# Patient Record
Sex: Male | Born: 2000 | Race: Black or African American | Hispanic: No | Marital: Single | Smoking: Never smoker
Health system: Southern US, Community
[De-identification: ages and names within clinical notes are randomized; demographics above are authoritative.]

---
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
## 2019-10-20 ENCOUNTER — Ambulatory Visit (INDEPENDENT_AMBULATORY_CARE_PROVIDER_SITE_OTHER): Payer: BC Managed Care – PPO | Admitting: Family Medicine

## 2019-10-20 ENCOUNTER — Encounter: Payer: Self-pay | Admitting: Family Medicine

## 2019-10-20 VITALS — BP 114/68 | HR 79 | Temp 97.7°F | Ht 73.23 in | Wt 133.3 lb

## 2019-10-20 DIAGNOSIS — R636 Underweight: Secondary | ICD-10-CM | POA: Diagnosis not present

## 2019-10-20 DIAGNOSIS — Z13220 Encounter for screening for lipoid disorders: Secondary | ICD-10-CM

## 2019-10-20 DIAGNOSIS — Z0000 Encounter for general adult medical examination without abnormal findings: Secondary | ICD-10-CM | POA: Diagnosis not present

## 2019-10-20 LAB — COMPLETE METABOLIC PANEL WITH GFR
AG Ratio: 1.6 (calc) (ref 1.0–2.5)
ALT: 17 U/L (ref 9–46)
AST: 18 U/L (ref 10–40)
Albumin: 4.5 g/dL (ref 3.6–5.1)
Alkaline phosphatase (APISO): 62 U/L (ref 36–130)
BUN: 14 mg/dL (ref 7–25)
CO2: 28 mmol/L (ref 20–32)
Calcium: 9.6 mg/dL (ref 8.6–10.3)
Chloride: 104 mmol/L (ref 98–110)
Creat: 0.79 mg/dL (ref 0.60–1.35)
GFR, Est African American: 149 mL/min/{1.73_m2} (ref >=60)
GFR, Est Non African American: 128 mL/min/{1.73_m2} (ref >=60)
Globulin: 2.8 g/dL (calc) (ref 1.9–3.7)
Glucose, Bld: 83 mg/dL (ref 65–99)
Potassium: 4.2 mmol/L (ref 3.5–5.3)
Sodium: 139 mmol/L (ref 135–146)
Total Bilirubin: 0.6 mg/dL (ref 0.2–1.2)
Total Protein: 7.3 g/dL (ref 6.1–8.1)

## 2019-10-20 LAB — LIPID PANEL
Cholesterol: 143 mg/dL (ref <200)
HDL: 58 mg/dL (ref >=40)
LDL Cholesterol (Calc): 73 mg/dL (calc)
Non-HDL Cholesterol (Calc): 85 mg/dL (calc) (ref <130)
Total CHOL/HDL Ratio: 2.5 (calc) (ref <5.0)
Triglycerides: 43 mg/dL (ref <150)

## 2019-10-20 LAB — CBC
HCT: 48.1 % (ref 38.5–50.0)
Hemoglobin: 16.3 g/dL (ref 13.2–17.1)
MCH: 31 pg (ref 27.0–33.0)
MCHC: 33.9 g/dL (ref 32.0–36.0)
MCV: 91.6 fL (ref 80.0–100.0)
MPV: 10 fL (ref 7.5–12.5)
Platelets: 184 10*3/uL (ref 140–400)
RBC: 5.25 10*6/uL (ref 4.20–5.80)
RDW: 12.2 % (ref 11.0–15.0)
WBC: 3.6 10*3/uL — ABNORMAL LOW (ref 3.8–10.8)

## 2019-10-20 LAB — TSH: TSH: 1.86 mIU/L (ref 0.40–4.50)

## 2019-10-20 NOTE — Patient Instructions (Signed)
Preventive Care 19-21 Years Old, Male Preventive care refers to lifestyle choices and visits with your health care provider that can promote health and wellness. This includes:  A yearly physical exam. This is also called an annual well check.  Regular dental and eye exams.  Immunizations.  Screening for certain conditions.  Healthy lifestyle choices, such as eating a healthy diet, getting regular exercise, not using drugs or products that contain nicotine and tobacco, and limiting alcohol use. What can I expect for my preventive care visit? Physical exam Your health care provider will check:  Height and weight. These may be used to calculate body mass index (BMI), which is a measurement that tells if you are at a healthy weight.  Heart rate and blood pressure.  Your skin for abnormal spots. Counseling Your health care provider may ask you questions about:  Alcohol, tobacco, and drug use.  Emotional well-being.  Home and relationship well-being.  Sexual activity.  Eating habits.  Work and work Statistician. What immunizations do I need?  Influenza (flu) vaccine  This is recommended every year. Tetanus, diphtheria, and pertussis (Tdap) vaccine  You may need a Td booster every 10 years. Varicella (chickenpox) vaccine  You may need this vaccine if you have not already been vaccinated. Human papillomavirus (HPV) vaccine  If recommended by your health care provider, you may need three doses over 6 months. Measles, mumps, and rubella (MMR) vaccine  You may need at least one dose of MMR. You may also need a second dose. Meningococcal conjugate (MenACWY) vaccine  One dose is recommended if you are 45-76 years old and a Market researcher living in a residence hall, or if you have one of several medical conditions. You may also need additional booster doses. Pneumococcal conjugate (PCV13) vaccine  You may need this if you have certain conditions and were not  previously vaccinated. Pneumococcal polysaccharide (PPSV23) vaccine  You may need one or two doses if you smoke cigarettes or if you have certain conditions. Hepatitis A vaccine  You may need this if you have certain conditions or if you travel or work in places where you may be exposed to hepatitis A. Hepatitis B vaccine  You may need this if you have certain conditions or if you travel or work in places where you may be exposed to hepatitis B. Haemophilus influenzae type b (Hib) vaccine  You may need this if you have certain risk factors. You may receive vaccines as individual doses or as more than one vaccine together in one shot (combination vaccines). Talk with your health care provider about the risks and benefits of combination vaccines. What tests do I need? Blood tests  Lipid and cholesterol levels. These may be checked every 5 years starting at age 17.  Hepatitis C test.  Hepatitis B test. Screening   Diabetes screening. This is done by checking your blood sugar (glucose) after you have not eaten for a while (fasting).  Sexually transmitted disease (STD) testing. Talk with your health care provider about your test results, treatment options, and if necessary, the need for more tests. Follow these instructions at home: Eating and drinking   Eat a diet that includes fresh fruits and vegetables, whole grains, lean protein, and low-fat dairy products.  Take vitamin and mineral supplements as recommended by your health care provider.  Do not drink alcohol if your health care provider tells you not to drink.  If you drink alcohol: ? Limit how much you have to 0-2  drinks a day. ? Be aware of how much alcohol is in your drink. In the [place] one drink equals one 12 oz bottle of beer (355 mL), one 5 oz glass of wine (148 mL), or one 1 oz glass of hard liquor (44 mL). Lifestyle  Take daily care of your teeth and gums.  Stay active. Exercise for at least 30 minutes on 5 or  more days each week.  Do not use any products that contain nicotine or tobacco, such as cigarettes, e-cigarettes, and chewing tobacco. If you need help quitting, ask your health care provider.  If you are sexually active, practice safe sex. Use a condom or other form of protection to prevent STIs (sexually transmitted infections). What's next?  Go to your health care provider once a year for a well check visit.  Ask your health care provider how often you should have your eyes and teeth checked.  Stay up to date on all vaccines. This information is not intended to replace advice given to you by your health care provider. Make sure you discuss any questions you have with your health care provider. Document Revised: 05/23/2018 Document Reviewed: 05/23/2018 Elsevier Patient Education  2020 [employer].

## 2019-10-20 NOTE — Assessment & Plan Note (Signed)
Well adult Orders Placed This Encounter  Procedures  . COMPLETE METABOLIC PANEL WITH GFR  . TSH  . Lipid Profile  . CBC  Screening: Lipid. Checking TSH due to difficulty gaining weight.  Immunizations: UTD Anticipatory guidance/Risk factor reduction:  Recommendations per AVS

## 2019-10-20 NOTE — Progress Notes (Signed)
Manuel Nelson - 19 y.o. male MRN [id number]  Date of birth: June 20, 2000  Subjective Chief Complaint  Patient presents with  . Establish Care    HPI Manuel Nelson is a 19 y.o. male here today for initial visit and annual exam.  He has been in good health.  He has concern about having difficulty gaining weight.  History of thyroid issues in a couple of grandparents, unsure what type.  He has been looking at joining the [employer] but currently is not meeting height/weight requirement.    He does exercise occasionally.  He follows a pretty healthy diet.    He does consume EtOH socially.  He is a non-smoker.   Review of Systems  Constitutional: Negative for chills, fever, malaise/fatigue and weight loss.  HENT: Negative for congestion, ear pain and sore throat.   Eyes: Negative for blurred vision, double vision and pain.  Respiratory: Negative for cough and shortness of breath.   Cardiovascular: Negative for chest pain and palpitations.  Gastrointestinal: Negative for abdominal pain, blood in stool, constipation, heartburn and nausea.  Genitourinary: Negative for dysuria and urgency.  Musculoskeletal: Negative for joint pain and myalgias.  Neurological: Negative for dizziness and headaches.  Endo/Heme/Allergies: Does not bruise/bleed easily.  Psychiatric/Behavioral: Negative for depression. The patient is not nervous/anxious and does not have insomnia.     No Known Allergies  History reviewed. No pertinent past medical history.  History reviewed. No pertinent surgical history.  Social History   Socioeconomic History  . Marital status: Single    Spouse name: Not on file  . Number of children: Not on file  . Years of education: Not on file  . Highest education level: Not on file  Occupational History  . Occupation: Chef  Tobacco Use  . Smoking status: Never Smoker  . Smokeless tobacco: Never Used  Substance and Sexual Activity  . Alcohol use: No    Alcohol/week: 0.0 standard  drinks  . Drug use: No  . Sexual activity: Never    Partners: Female  Other Topics Concern  . Not on file  Social History Narrative  . Not on file   Social Determinants of Health   Financial Resource Strain:   . Difficulty of Paying Living Expenses:   Food Insecurity:   . Worried About Charity fundraiser in the Last Year:   . Arboriculturist in the Last Year:   Transportation Needs:   . Film/video editor (Medical):   Marland Kitchen Lack of Transportation (Non-Medical):   Physical Activity:   . Days of Exercise per Week:   . Minutes of Exercise per Session:   Stress:   . Feeling of Stress :   Social Connections:   . Frequency of Communication with Friends and Family:   . Frequency of Social Gatherings with Friends and Family:   . Attends Religious Services:   . Active Member of Clubs or Organizations:   . Attends Archivist Meetings:   Marland Kitchen Marital Status:     Family History  Problem Relation Age of Onset  . Thyroid disease Maternal Grandmother   . Hypertension Maternal Grandfather     Health Maintenance  Topic Date Due  . HIV Screening  Never done  . TETANUS/TDAP  Never done  . INFLUENZA VACCINE  01/11/2020  . COVID-19 Vaccine  Completed     ----------------------------------------------------------------------------------------------------------------------------------------------------------------------------------------------------------------- Physical Exam BP 114/68 (BP Location: Left Arm, Patient Position: Sitting, Cuff Size: Normal)   Pulse 79   Temp 97.7 F (  36.5 C) (Oral)   Ht 6' 1.23" (1.86 m)   Wt 133 lb 4.8 oz (60.5 kg)   SpO2 100%   BMI 17.48 kg/m   Physical Exam Constitutional:      General: He is not in acute distress. HENT:     Head: Normocephalic and atraumatic.     Right Ear: Tympanic membrane and external ear normal.     Left Ear: Tympanic membrane and external ear normal.  Eyes:     General: No scleral icterus. Neck:      Thyroid: No thyromegaly.  Cardiovascular:     Rate and Rhythm: Normal rate and regular rhythm.     Heart sounds: Normal heart sounds.  Pulmonary:     Effort: Pulmonary effort is normal.     Breath sounds: Normal breath sounds.  Abdominal:     General: Bowel sounds are normal. There is no distension.     Palpations: Abdomen is soft.     Tenderness: There is no abdominal tenderness. There is no guarding.  Musculoskeletal:     Cervical back: Normal range of motion.  Lymphadenopathy:     Cervical: No cervical adenopathy.  Skin:    General: Skin is warm and dry.     Findings: No rash.  Neurological:     Mental Status: He is alert and oriented to person, place, and time.     Cranial Nerves: No cranial nerve deficit.     Motor: No abnormal muscle tone.  Psychiatric:        Behavior: Behavior normal.     ------------------------------------------------------------------------------------------------------------------------------------------------------------------------------------------------------------------- Assessment and Plan  Well adult exam Well adult Orders Placed This Encounter  Procedures  . COMPLETE METABOLIC PANEL WITH GFR  . TSH  . Lipid Profile  . CBC  Screening: Lipid. Checking TSH due to difficulty gaining weight.  Immunizations: UTD Anticipatory guidance/Risk factor reduction:  Recommendations per AVS   No orders of the defined types were placed in this encounter.   No follow-ups on file.    This visit occurred during the SARS-CoV-2 public health emergency.  Safety protocols were in place, including screening questions prior to the visit, additional usage of staff PPE, and extensive cleaning of exam room while observing appropriate contact time as indicated for disinfecting solutions.

## 2019-11-03 ENCOUNTER — Encounter: Payer: BC Managed Care – PPO | Admitting: Family Medicine

## 2020-11-15 ENCOUNTER — Encounter: Payer: Self-pay | Admitting: Family Medicine

## 2020-11-15 ENCOUNTER — Ambulatory Visit (INDEPENDENT_AMBULATORY_CARE_PROVIDER_SITE_OTHER): Payer: BC Managed Care – PPO | Admitting: Family Medicine

## 2020-11-15 ENCOUNTER — Other Ambulatory Visit: Payer: Self-pay

## 2020-11-15 VITALS — BP 131/75 | HR 98 | Temp 98.1°F | Ht 73.62 in | Wt 141.7 lb

## 2020-11-15 DIAGNOSIS — Z13220 Encounter for screening for lipoid disorders: Secondary | ICD-10-CM | POA: Diagnosis not present

## 2020-11-15 DIAGNOSIS — Z0000 Encounter for general adult medical examination without abnormal findings: Secondary | ICD-10-CM | POA: Diagnosis not present

## 2020-11-15 NOTE — Assessment & Plan Note (Signed)
Well adult Orders Placed This Encounter  Procedures  . COMPLETE METABOLIC PANEL WITH GFR  . CBC with Differential  . Lipid Panel w/reflex Direct LDL  Screening: Lipid panel Immunizations; UTD Anticipatory guidance/Risk factor reduction:  Recommendations per AVS. 

## 2020-11-15 NOTE — Progress Notes (Signed)
Manuel Nelson - 20 y.o. male MRN [id number]  Date of birth: February 10, 2001  Subjective Chief Complaint  Patient presents with  . Annual Exam    HPI Manuel Nelson is a 20 y.o. male here today for annual exam.  He has been in fairly good health.  He did recently return from boot camp for the [employer] after receiving medical discharge for rib pain.  Rib pain has since resolved.  He has no new concerns today.   He is a non-smoker and denies EtOH intake.   He does exercise regularly and feels like his diet is pretty good.    Review of Systems  Constitutional: Negative for chills, fever, malaise/fatigue and weight loss.  HENT: Negative for congestion, ear pain and sore throat.   Eyes: Negative for blurred vision, double vision and pain.  Respiratory: Negative for cough and shortness of breath.   Cardiovascular: Negative for chest pain and palpitations.  Gastrointestinal: Negative for abdominal pain, blood in stool, constipation, heartburn and nausea.  Genitourinary: Negative for dysuria and urgency.  Musculoskeletal: Negative for joint pain and myalgias.  Neurological: Negative for dizziness and headaches.  Endo/Heme/Allergies: Does not bruise/bleed easily.  Psychiatric/Behavioral: Negative for depression. The patient is not nervous/anxious and does not have insomnia.     No Known Allergies  History reviewed. No pertinent past medical history.  History reviewed. No pertinent surgical history.  Social History   Socioeconomic History  . Marital status: Single    Spouse name: Not on file  . Number of children: Not on file  . Years of education: Not on file  . Highest education level: Not on file  Occupational History  . Occupation: Chef  Tobacco Use  . Smoking status: Never Smoker  . Smokeless tobacco: Never Used  Vaping Use  . Vaping Use: Never used  Substance and Sexual Activity  . Alcohol use: No    Alcohol/week: 0.0 standard drinks  . Drug use: No  . Sexual activity:  Never    Partners: Female  Other Topics Concern  . Not on file  Social History Narrative  . Not on file   Social Determinants of Health   Financial Resource Strain: Not on file  Food Insecurity: Not on file  Transportation Needs: Not on file  Physical Activity: Not on file  Stress: Not on file  Social Connections: Not on file    Family History  Problem Relation Age of Onset  . Thyroid disease Maternal Grandmother   . Hypertension Maternal Grandfather     Health Maintenance  Topic Date Due  . HIV Screening  Never done  . Hepatitis C Screening  Never done  . COVID-19 Vaccine (3 - Booster for Moderna series) 02/17/2020  . INFLUENZA VACCINE  01/10/2021  . TETANUS/TDAP  09/16/2030  . Zoster Vaccines- Shingrix (1 of 2) 10/06/2048  . HPV VACCINES  Completed  . Pneumococcal Vaccine 48-66 Years old  Aged Out     ----------------------------------------------------------------------------------------------------------------------------------------------------------------------------------------------------------------- Physical Exam BP 131/75 (BP Location: Left Arm, Patient Position: Sitting, Cuff Size: Small)   Pulse 98   Temp 98.1 F (36.7 C)   Ht 6' 1.62" (1.87 m)   Wt 141 lb 11.2 oz (64.3 kg)   SpO2 99%   BMI 18.38 kg/m   Physical Exam Constitutional:      General: He is not in acute distress. HENT:     Head: Normocephalic and atraumatic.     Right Ear: External ear normal.     Left Ear: External ear  normal.     Mouth/Throat:     Mouth: Mucous membranes are moist.  Eyes:     General: No scleral icterus. Neck:     Thyroid: No thyromegaly.  Cardiovascular:     Rate and Rhythm: Normal rate and regular rhythm.     Heart sounds: Normal heart sounds.  Pulmonary:     Effort: Pulmonary effort is normal.     Breath sounds: Normal breath sounds.  Abdominal:     General: Bowel sounds are normal. There is no distension.     Palpations: Abdomen is soft.      Tenderness: There is no abdominal tenderness. There is no guarding.  Musculoskeletal:     Cervical back: Normal range of motion.  Lymphadenopathy:     Cervical: No cervical adenopathy.  Skin:    General: Skin is warm and dry.     Findings: No rash.  Neurological:     Mental Status: He is alert and oriented to person, place, and time.     Cranial Nerves: No cranial nerve deficit.     Motor: No abnormal muscle tone.  Psychiatric:        Mood and Affect: Mood normal.        Behavior: Behavior normal.     ------------------------------------------------------------------------------------------------------------------------------------------------------------------------------------------------------------------- Assessment and Plan  Well adult exam Well adult Orders Placed This Encounter  Procedures  . COMPLETE METABOLIC PANEL WITH GFR  . CBC with Differential  . Lipid Panel w/reflex Direct LDL  Screening: Lipid panel Immunizations: UTD Anticipatory guidance/Risk factor reduction:  Recommendations per AVS   No orders of the defined types were placed in this encounter.   No follow-ups on file.    This visit occurred during the SARS-CoV-2 public health emergency.  Safety protocols were in place, including screening questions prior to the visit, additional usage of staff PPE, and extensive cleaning of exam room while observing appropriate contact time as indicated for disinfecting solutions.

## 2020-11-15 NOTE — Patient Instructions (Signed)
Preventive Care 22-22 Years Old, Male Preventive care refers to lifestyle choices and visits with your health care provider that can promote health and wellness. This includes:  A yearly physical exam. This is also called an annual wellness visit.  Regular dental and eye exams.  Immunizations.  Screening for certain conditions.  Healthy lifestyle choices, such as: ? Eating a healthy diet. ? Getting regular exercise. ? Not using drugs or products that contain nicotine and tobacco. ? Limiting alcohol use. What can I expect for my preventive care visit? Physical exam Your health care provider may check your:  Height and weight. These may be used to calculate your BMI (body mass index). BMI is a measurement that tells if you are at a healthy weight.  Heart rate and blood pressure.  Body temperature.  Skin for abnormal spots. Counseling Your health care provider may ask you questions about your:  Past medical problems.  Family's medical history.  Alcohol, tobacco, and drug use.  Emotional well-being.  Home life and relationship well-being.  Sexual activity.  Diet, exercise, and sleep habits.  Work and work environment.  Access to firearms. What immunizations do I need? Vaccines are usually given at various ages, according to a schedule. Your health care provider will recommend vaccines for you based on your age, medical history, and lifestyle or other factors, such as travel or where you work.   What tests do I need? Blood tests  Lipid and cholesterol levels. These may be checked every 5 years starting at age 20.  Hepatitis C test.  Hepatitis B test. Screening  Diabetes screening. This is done by checking your blood sugar (glucose) after you have not eaten for a while (fasting).  Genital exam to check for testicular cancer or hernias.  STD (sexually transmitted disease) testing, if you are at risk. Talk with your health care provider about your test results,  treatment options, and if necessary, the need for more tests.   Follow these instructions at home: Eating and drinking  Eat a healthy diet that includes fresh fruits and vegetables, whole grains, lean protein, and low-fat dairy products.  Drink enough fluid to keep your urine pale yellow.  Take vitamin and mineral supplements as recommended by your health care provider.  Do not drink alcohol if your health care provider tells you not to drink.  If you drink alcohol: ? Limit how much you have to 0-2 drinks a day. ? Be aware of how much alcohol is in your drink. In the [place] one drink equals one 12 oz bottle of beer (355 mL), one 5 oz glass of wine (148 mL), or one 1 oz glass of hard liquor (44 mL).   Lifestyle  Take daily care of your teeth and gums. Brush your teeth every morning and night with fluoride toothpaste. Floss one time each day.  Stay active. Exercise for at least 30 minutes 5 or more days each week.  Do not use any products that contain nicotine or tobacco, such as cigarettes, e-cigarettes, and chewing tobacco. If you need help quitting, ask your health care provider.  Do not use drugs.  If you are sexually active, practice safe sex. Use a condom or other form of protection to prevent STIs (sexually transmitted infections).  Find healthy ways to cope with stress, such as: ? Meditation, yoga, or listening to music. ? Journaling. ? Talking to a trusted person. ? Spending time with friends and family. Safety  Always wear your seat belt while driving   or riding in a vehicle.  Do not drive: ? If you have been drinking alcohol. Do not ride with someone who has been drinking. ? When you are tired or distracted. ? While texting.  Wear a helmet and other protective equipment during sports activities.  If you have firearms in your house, make sure you follow all gun safety procedures.  Seek help if you have been physically or sexually abused. What's next?  Go to your  health care provider once a year for an annual wellness visit.  Ask your health care provider how often you should have your eyes and teeth checked.  Stay up to date on all vaccines. This information is not intended to replace advice given to you by your health care provider. Make sure you discuss any questions you have with your health care provider. Document Revised: 02/12/2019 Document Reviewed: 05/23/2018 Elsevier Patient Education  2021 Elsevier Inc.  

## 2020-11-16 LAB — COMPLETE METABOLIC PANEL WITH GFR
AG Ratio: 1.2 (calc) (ref 1.0–2.5)
ALT: 108 U/L — ABNORMAL HIGH (ref 9–46)
AST: 36 U/L (ref 10–40)
Albumin: 4.3 g/dL (ref 3.6–5.1)
Alkaline phosphatase (APISO): 108 U/L (ref 36–130)
BUN: 14 mg/dL (ref 7–25)
CO2: 29 mmol/L (ref 20–32)
Calcium: 9.9 mg/dL (ref 8.6–10.3)
Chloride: 99 mmol/L (ref 98–110)
Creat: 0.96 mg/dL (ref 0.60–1.35)
GFR, Est African American: 130 mL/min/{1.73_m2} (ref >=60)
GFR, Est Non African American: 112 mL/min/{1.73_m2} (ref >=60)
Globulin: 3.7 g/dL (calc) (ref 1.9–3.7)
Glucose, Bld: 108 mg/dL — ABNORMAL HIGH (ref 65–99)
Potassium: 4.3 mmol/L (ref 3.5–5.3)
Sodium: 139 mmol/L (ref 135–146)
Total Bilirubin: 0.5 mg/dL (ref 0.2–1.2)
Total Protein: 8 g/dL (ref 6.1–8.1)

## 2020-11-16 LAB — LIPID PANEL W/REFLEX DIRECT LDL
Cholesterol: 197 mg/dL (ref <200)
HDL: 53 mg/dL (ref >=40)
LDL Cholesterol (Calc): 124 mg/dL (calc) — ABNORMAL HIGH
Non-HDL Cholesterol (Calc): 144 mg/dL (calc) — ABNORMAL HIGH (ref <130)
Total CHOL/HDL Ratio: 3.7 (calc) (ref <5.0)
Triglycerides: 94 mg/dL (ref <150)

## 2020-11-16 LAB — CBC WITH DIFFERENTIAL/PLATELET
Absolute Monocytes: 360 cells/uL (ref 200–950)
Basophils Absolute: 20 cells/uL (ref 0–200)
Basophils Relative: 0.3 %
Eosinophils Absolute: 88 cells/uL (ref 15–500)
Eosinophils Relative: 1.3 %
HCT: 48.5 % (ref 38.5–50.0)
Hemoglobin: 15.8 g/dL (ref 13.2–17.1)
Lymphs Abs: 1625 cells/uL (ref 850–3900)
MCH: 29.3 pg (ref 27.0–33.0)
MCHC: 32.6 g/dL (ref 32.0–36.0)
MCV: 89.8 fL (ref 80.0–100.0)
MPV: 9.8 fL (ref 7.5–12.5)
Monocytes Relative: 5.3 %
Neutro Abs: 4706 cells/uL (ref 1500–7800)
Neutrophils Relative %: 69.2 %
Platelets: 278 10*3/uL (ref 140–400)
RBC: 5.4 10*6/uL (ref 4.20–5.80)
RDW: 12.4 % (ref 11.0–15.0)
Total Lymphocyte: 23.9 %
WBC: 6.8 10*3/uL (ref 3.8–10.8)

## 2021-01-10 ENCOUNTER — Other Ambulatory Visit: Payer: Self-pay

## 2021-01-10 DIAGNOSIS — R748 Abnormal levels of other serum enzymes: Secondary | ICD-10-CM

## 2021-01-10 NOTE — Progress Notes (Signed)
Ordered labs

## 2021-01-11 LAB — COMPLETE METABOLIC PANEL WITH GFR
AG Ratio: 1.9 (calc) (ref 1.0–2.5)
ALT: 33 U/L (ref 9–46)
AST: 21 U/L (ref 10–40)
Albumin: 4.7 g/dL (ref 3.6–5.1)
Alkaline phosphatase (APISO): 73 U/L (ref 36–130)
BUN: 12 mg/dL (ref 7–25)
CO2: 29 mmol/L (ref 20–32)
Calcium: 9.4 mg/dL (ref 8.6–10.3)
Chloride: 103 mmol/L (ref 98–110)
Creat: 0.87 mg/dL (ref 0.60–1.24)
Globulin: 2.5 g/dL (calc) (ref 1.9–3.7)
Glucose, Bld: 80 mg/dL (ref 65–99)
Potassium: 4.4 mmol/L (ref 3.5–5.3)
Sodium: 139 mmol/L (ref 135–146)
Total Bilirubin: 0.6 mg/dL (ref 0.2–1.2)
Total Protein: 7.2 g/dL (ref 6.1–8.1)
eGFR: 125 mL/min/{1.73_m2} (ref >=60)

## 2022-01-24 ENCOUNTER — Ambulatory Visit (INDEPENDENT_AMBULATORY_CARE_PROVIDER_SITE_OTHER): Payer: BC Managed Care – PPO | Admitting: Family Medicine

## 2022-01-24 ENCOUNTER — Encounter: Payer: Self-pay | Admitting: Family Medicine

## 2022-01-24 VITALS — BP 101/63 | HR 90 | Ht 74.0 in | Wt 149.0 lb

## 2022-01-24 DIAGNOSIS — N62 Hypertrophy of breast: Secondary | ICD-10-CM | POA: Diagnosis not present

## 2022-01-24 DIAGNOSIS — Z13220 Encounter for screening for lipoid disorders: Secondary | ICD-10-CM | POA: Diagnosis not present

## 2022-01-24 DIAGNOSIS — Z0000 Encounter for general adult medical examination without abnormal findings: Secondary | ICD-10-CM | POA: Diagnosis not present

## 2022-01-24 NOTE — Assessment & Plan Note (Signed)
Well adult Orders Placed This Encounter  Procedures  . COMPLETE METABOLIC PANEL WITH GFR  . Prolactin  . Testosterone  . CBC with Differential  Screenings: Per lab orders.  Checking prolactin and testosterone She does have some gynecomastia. Immunizations: Up-to-date Anticipatory guidance/risk factor reduction: Recommendations per AVS

## 2022-01-24 NOTE — Patient Instructions (Signed)
Preventive Care 21-23 Years Old, Male Preventive care refers to lifestyle choices and visits with your health care provider that can promote health and wellness. Preventive care visits are also called wellness exams. What can I expect for my preventive care visit? Counseling During your preventive care visit, your health care provider may ask about your: Medical history, including: Past medical problems. Family medical history. Current health, including: Emotional well-being. Home life and relationship well-being. Sexual activity. Lifestyle, including: Alcohol, nicotine or tobacco, and drug use. Access to firearms. Diet, exercise, and sleep habits. Safety issues such as seatbelt and bike helmet use. Sunscreen use. Work and work environment. Physical exam Your health care provider may check your: Height and weight. These may be used to calculate your BMI (body mass index). BMI is a measurement that tells if you are at a healthy weight. Waist circumference. This measures the distance around your waistline. This measurement also tells if you are at a healthy weight and may help predict your risk of certain diseases, such as type 2 diabetes and high blood pressure. Heart rate and blood pressure. Body temperature. Skin for abnormal spots. What immunizations do I need?  Vaccines are usually given at various ages, according to a schedule. Your health care provider will recommend vaccines for you based on your age, medical history, and lifestyle or other factors, such as travel or where you work. What tests do I need? Screening Your health care provider may recommend screening tests for certain conditions. This may include: Lipid and cholesterol levels. Diabetes screening. This is done by checking your blood sugar (glucose) after you have not eaten for a while (fasting). Hepatitis B test. Hepatitis C test. HIV (human immunodeficiency virus) test. STI (sexually transmitted infection)  testing, if you are at risk. Talk with your health care provider about your test results, treatment options, and if necessary, the need for more tests. Follow these instructions at home: Eating and drinking  Eat a healthy diet that includes fresh fruits and vegetables, whole grains, lean protein, and low-fat dairy products. Drink enough fluid to keep your urine pale yellow. Take vitamin and mineral supplements as recommended by your health care provider. Do not drink alcohol if your health care provider tells you not to drink. If you drink alcohol: Limit how much you have to 0-2 drinks a day. Know how much alcohol is in your drink. In the [place] one drink equals one 12 oz bottle of beer (355 mL), one 5 oz glass of wine (148 mL), or one 1 oz glass of hard liquor (44 mL). Lifestyle Brush your teeth every morning and night with fluoride toothpaste. Floss one time each day. Exercise for at least 30 minutes 5 or more days each week. Do not use any products that contain nicotine or tobacco. These products include cigarettes, chewing tobacco, and vaping devices, such as e-cigarettes. If you need help quitting, ask your health care provider. Do not use drugs. If you are sexually active, practice safe sex. Use a condom or other form of protection to prevent STIs. Find healthy ways to manage stress, such as: Meditation, yoga, or listening to music. Journaling. Talking to a trusted person. Spending time with friends and family. Minimize exposure to UV radiation to reduce your risk of skin cancer. Safety Always wear your seat belt while driving or riding in a vehicle. Do not drive: If you have been drinking alcohol. Do not ride with someone who has been drinking. If you have been using any mind-altering substances   or drugs. While texting. When you are tired or distracted. Wear a helmet and other protective equipment during sports activities. If you have firearms in your house, make sure you  follow all gun safety procedures. Seek help if you have been physically or sexually abused. What's next? Go to your health care provider once a year for an annual wellness visit. Ask your health care provider how often you should have your eyes and teeth checked. Stay up to date on all vaccines. This information is not intended to replace advice given to you by your health care provider. Make sure you discuss any questions you have with your health care provider. Document Revised: 11/24/2020 Document Reviewed: 11/24/2020 Elsevier Patient Education  2023 Elsevier Inc.  

## 2022-01-24 NOTE — Progress Notes (Signed)
Manuel Nelson - 21 y.o. male MRN [id number]  Date of birth: 11-Oct-2000  Subjective Chief Complaint  Patient presents with   Annual Exam    HPI Cesare is a 21 year old male here today for annual exam.  Reports he is doing well at this time.  He is working and going to school.  Continues to stay very active.  He does follow a healthy diet.  He is a non-smoker.  He rarely consumes alcohol.  Immunizations are up-to-date.  Review of Systems  Constitutional:  Negative for chills, fever, malaise/fatigue and weight loss.  HENT:  Negative for congestion, ear pain and sore throat.   Eyes:  Negative for blurred vision, double vision and pain.  Respiratory:  Negative for cough and shortness of breath.   Cardiovascular:  Negative for chest pain and palpitations.  Gastrointestinal:  Negative for abdominal pain, blood in stool, constipation, heartburn and nausea.  Genitourinary:  Negative for dysuria and urgency.  Musculoskeletal:  Negative for joint pain and myalgias.  Neurological:  Negative for dizziness and headaches.  Endo/Heme/Allergies:  Does not bruise/bleed easily.  Psychiatric/Behavioral:  Negative for depression. The patient is not nervous/anxious and does not have insomnia.     No Known Allergies  History reviewed. No pertinent past medical history.  History reviewed. No pertinent surgical history.  Social History   Socioeconomic History   Marital status: Single    Spouse name: Not on file   Number of children: Not on file   Years of education: Not on file   Highest education level: Not on file  Occupational History   Occupation: Chef  Tobacco Use   Smoking status: Never   Smokeless tobacco: Never  Vaping Use   Vaping Use: Never used  Substance and Sexual Activity   Alcohol use: No    Alcohol/week: 0.0 standard drinks of alcohol   Drug use: No   Sexual activity: Never    Partners: Female  Other Topics Concern   Not on file  Social History Narrative   Not on  file   Social Determinants of Health   Financial Resource Strain: Not on file  Food Insecurity: Not on file  Transportation Needs: Not on file  Physical Activity: Not on file  Stress: Not on file  Social Connections: Not on file    Family History  Problem Relation Age of Onset   Thyroid disease Maternal Grandmother    Hypertension Maternal Grandfather     Health Maintenance  Topic Date Due   COVID-19 Vaccine (3 - Moderna series) 06/12/2022 (Originally 11/12/2019)   INFLUENZA VACCINE  09/10/2022 (Originally 01/10/2022)   Hepatitis C Screening  01/25/2023 (Originally 10/06/2016)   HIV Screening  01/25/2023 (Originally 10/06/2013)   TETANUS/TDAP  09/16/2030   HPV VACCINES  Completed     ----------------------------------------------------------------------------------------------------------------------------------------------------------------------------------------------------------------- Physical Exam BP 101/63 (BP Location: Left Arm, Patient Position: Sitting, Cuff Size: Normal)   Pulse 90   Ht 6\' 2"  (1.88 m)   Wt 149 lb (67.6 kg)   SpO2 98%   BMI 19.13 kg/m   Physical Exam Constitutional:      General: He is not in acute distress. HENT:     Head: Normocephalic and atraumatic.     Right Ear: Tympanic membrane and external ear normal.     Left Ear: Tympanic membrane and external ear normal.  Eyes:     General: No scleral icterus. Neck:     Thyroid: No thyromegaly.  Cardiovascular:     Rate and Rhythm: Normal rate and  regular rhythm.     Heart sounds: Normal heart sounds.  Pulmonary:     Effort: Pulmonary effort is normal.     Breath sounds: Normal breath sounds.     Comments: Gynecomastia noticed. Abdominal:     General: Bowel sounds are normal. There is no distension.     Palpations: Abdomen is soft.     Tenderness: There is no abdominal tenderness. There is no guarding.  Musculoskeletal:     Cervical back: Normal range of motion.  Lymphadenopathy:      Cervical: No cervical adenopathy.  Skin:    General: Skin is warm and dry.     Findings: No rash.  Neurological:     Mental Status: He is alert and oriented to person, place, and time.     Cranial Nerves: No cranial nerve deficit.     Motor: No abnormal muscle tone.  Psychiatric:        Mood and Affect: Mood normal.        Behavior: Behavior normal.     ------------------------------------------------------------------------------------------------------------------------------------------------------------------------------------------------------------------- Assessment and Plan  Well adult exam Well adult Orders Placed This Encounter  Procedures   COMPLETE METABOLIC PANEL WITH GFR   Prolactin   Testosterone   CBC with Differential  Screenings: Per lab orders.  Checking prolactin and testosterone She does have some gynecomastia. Immunizations: Up-to-date Anticipatory guidance/risk factor reduction: Recommendations per AVS   No orders of the defined types were placed in this encounter.   No follow-ups on file.    This visit occurred during the SARS-CoV-2 public health emergency.  Safety protocols were in place, including screening questions prior to the visit, additional usage of staff PPE, and extensive cleaning of exam room while observing appropriate contact time as indicated for disinfecting solutions.

## 2022-01-25 LAB — TESTOSTERONE: Testosterone: 424 ng/dL (ref 250–827)

## 2022-01-25 LAB — CBC WITH DIFFERENTIAL/PLATELET
Absolute Monocytes: 441 cells/uL (ref 200–950)
Basophils Absolute: 28 cells/uL (ref 0–200)
Basophils Relative: 0.4 %
Eosinophils Absolute: 224 cells/uL (ref 15–500)
Eosinophils Relative: 3.2 %
HCT: 51.3 % — ABNORMAL HIGH (ref 38.5–50.0)
Hemoglobin: 17.4 g/dL — ABNORMAL HIGH (ref 13.2–17.1)
Lymphs Abs: 2058 cells/uL (ref 850–3900)
MCH: 31.1 pg (ref 27.0–33.0)
MCHC: 33.9 g/dL (ref 32.0–36.0)
MCV: 91.8 fL (ref 80.0–100.0)
MPV: 10.4 fL (ref 7.5–12.5)
Monocytes Relative: 6.3 %
Neutro Abs: 4249 cells/uL (ref 1500–7800)
Neutrophils Relative %: 60.7 %
Platelets: 212 10*3/uL (ref 140–400)
RBC: 5.59 10*6/uL (ref 4.20–5.80)
RDW: 12.6 % (ref 11.0–15.0)
Total Lymphocyte: 29.4 %
WBC: 7 10*3/uL (ref 3.8–10.8)

## 2022-01-25 LAB — PROLACTIN: Prolactin: 8.4 ng/mL (ref 2.0–18.0)

## 2022-01-25 LAB — COMPLETE METABOLIC PANEL WITH GFR
AG Ratio: 1.7 (calc) (ref 1.0–2.5)
ALT: 25 U/L (ref 9–46)
AST: 21 U/L (ref 10–40)
Albumin: 4.5 g/dL (ref 3.6–5.1)
Alkaline phosphatase (APISO): 55 U/L (ref 36–130)
BUN: 13 mg/dL (ref 7–25)
CO2: 27 mmol/L (ref 20–32)
Calcium: 9.6 mg/dL (ref 8.6–10.3)
Chloride: 103 mmol/L (ref 98–110)
Creat: 0.94 mg/dL (ref 0.60–1.24)
Globulin: 2.7 g/dL (calc) (ref 1.9–3.7)
Glucose, Bld: 99 mg/dL (ref 65–99)
Potassium: 3.9 mmol/L (ref 3.5–5.3)
Sodium: 140 mmol/L (ref 135–146)
Total Bilirubin: 0.7 mg/dL (ref 0.2–1.2)
Total Protein: 7.2 g/dL (ref 6.1–8.1)
eGFR: 117 mL/min/{1.73_m2} (ref >=60)

## 2022-02-05 ENCOUNTER — Other Ambulatory Visit: Payer: Self-pay | Admitting: Family Medicine

## 2022-02-05 DIAGNOSIS — D582 Other hemoglobinopathies: Secondary | ICD-10-CM

## 2023-01-26 ENCOUNTER — Encounter: Payer: BC Managed Care – PPO | Admitting: Family Medicine

## 2023-01-31 ENCOUNTER — Encounter: Payer: Self-pay | Admitting: Family Medicine

## 2023-01-31 ENCOUNTER — Ambulatory Visit (INDEPENDENT_AMBULATORY_CARE_PROVIDER_SITE_OTHER): Payer: 59 | Admitting: Family Medicine

## 2023-01-31 VITALS — BP 111/71 | HR 93 | Ht 74.0 in | Wt 142.0 lb

## 2023-01-31 DIAGNOSIS — Z0000 Encounter for general adult medical examination without abnormal findings: Secondary | ICD-10-CM | POA: Diagnosis not present

## 2023-01-31 DIAGNOSIS — Z13220 Encounter for screening for lipoid disorders: Secondary | ICD-10-CM | POA: Diagnosis not present

## 2023-01-31 NOTE — Patient Instructions (Signed)
Preventive Care 21-24 Years Old, Male Preventive care refers to lifestyle choices and visits with your health care provider that can promote health and wellness. Preventive care visits are also called wellness exams. What can I expect for my preventive care visit? Counseling During your preventive care visit, your health care provider may ask about your: Medical history, including: Past medical problems. Family medical history. Current health, including: Emotional well-being. Home life and relationship well-being. Sexual activity. Lifestyle, including: Alcohol, nicotine or tobacco, and drug use. Access to firearms. Diet, exercise, and sleep habits. Safety issues such as seatbelt and bike helmet use. Sunscreen use. Work and work environment. Physical exam Your health care provider may check your: Height and weight. These may be used to calculate your BMI (body mass index). BMI is a measurement that tells if you are at a healthy weight. Waist circumference. This measures the distance around your waistline. This measurement also tells if you are at a healthy weight and may help predict your risk of certain diseases, such as type 2 diabetes and high blood pressure. Heart rate and blood pressure. Body temperature. Skin for abnormal spots. What immunizations do I need?  Vaccines are usually given at various ages, according to a schedule. Your health care provider will recommend vaccines for you based on your age, medical history, and lifestyle or other factors, such as travel or where you work. What tests do I need? Screening Your health care provider may recommend screening tests for certain conditions. This may include: Lipid and cholesterol levels. Diabetes screening. This is done by checking your blood sugar (glucose) after you have not eaten for a while (fasting). Hepatitis B test. Hepatitis C test. HIV (human immunodeficiency virus) test. STI (sexually transmitted infection)  testing, if you are at risk. Talk with your health care provider about your test results, treatment options, and if necessary, the need for more tests. Follow these instructions at home: Eating and drinking  Eat a healthy diet that includes fresh fruits and vegetables, whole grains, lean protein, and low-fat dairy products. Drink enough fluid to keep your urine pale yellow. Take vitamin and mineral supplements as recommended by your health care provider. Do not drink alcohol if your health care provider tells you not to drink. If you drink alcohol: Limit how much you have to 0-2 drinks a day. Know how much alcohol is in your drink. In the [place] one drink equals one 12 oz bottle of beer (355 mL), one 5 oz glass of wine (148 mL), or one 1 oz glass of hard liquor (44 mL). Lifestyle Brush your teeth every morning and night with fluoride toothpaste. Floss one time each day. Exercise for at least 30 minutes 5 or more days each week. Do not use any products that contain nicotine or tobacco. These products include cigarettes, chewing tobacco, and vaping devices, such as e-cigarettes. If you need help quitting, ask your health care provider. Do not use drugs. If you are sexually active, practice safe sex. Use a condom or other form of protection to prevent STIs. Find healthy ways to manage stress, such as: Meditation, yoga, or listening to music. Journaling. Talking to a trusted person. Spending time with friends and family. Minimize exposure to UV radiation to reduce your risk of skin cancer. Safety Always wear your seat belt while driving or riding in a vehicle. Do not drive: If you have been drinking alcohol. Do not ride with someone who has been drinking. If you have been using any mind-altering substances   or drugs. While texting. When you are tired or distracted. Wear a helmet and other protective equipment during sports activities. If you have firearms in your house, make sure you  follow all gun safety procedures. Seek help if you have been physically or sexually abused. What's next? Go to your health care provider once a year for an annual wellness visit. Ask your health care provider how often you should have your eyes and teeth checked. Stay up to date on all vaccines. This information is not intended to replace advice given to you by your health care provider. Make sure you discuss any questions you have with your health care provider. Document Revised: 11/24/2020 Document Reviewed: 11/24/2020 Elsevier Patient Education  2024 [employer] Inc.  

## 2023-01-31 NOTE — Addendum Note (Signed)
Addended by: Mammie Lorenzo on: 01/31/2023 04:08 PM   Modules accepted: Orders

## 2023-01-31 NOTE — Assessment & Plan Note (Signed)
Well adult Orders Placed This Encounter  Procedures   TSH   Lipid Panel w/reflex Direct LDL   COMPLETE METABOLIC PANEL WITH GFR   CBC with Differential/Platelet  Screenings: per lab orders Immunizations:  UTD Anticipatory guidance/Risk factor reduction:  Recommendations per AVS.

## 2023-01-31 NOTE — Progress Notes (Signed)
Manuel Nelson - 22 y.o. male MRN [id number]  Date of birth: 2001/03/17  Subjective No chief complaint on file.   HPI Manuel Nelson is a 22 y.o. male here today for annual exam.  He reports that he is doing pretty well.   Denies new concerns today.   He remains pretty active and feels that diet is pretty good.   Non-smoker.  Denies EtOH.   Review of Systems  Constitutional:  Negative for chills, fever, malaise/fatigue and weight loss.  HENT:  Negative for congestion, ear pain and sore throat.   Eyes:  Negative for blurred vision, double vision and pain.  Respiratory:  Negative for cough and shortness of breath.   Cardiovascular:  Negative for chest pain and palpitations.  Gastrointestinal:  Negative for abdominal pain, blood in stool, constipation, heartburn and nausea.  Genitourinary:  Negative for dysuria and urgency.  Musculoskeletal:  Negative for joint pain and myalgias.  Neurological:  Negative for dizziness and headaches.  Endo/Heme/Allergies:  Does not bruise/bleed easily.  Psychiatric/Behavioral:  Negative for depression. The patient is not nervous/anxious and does not have insomnia.       No Known Allergies  No past medical history on file.  No past surgical history on file.  Social History   Socioeconomic History   Marital status: Single    Spouse name: Not on file   Number of children: Not on file   Years of education: Not on file   Highest education level: Not on file  Occupational History   Occupation: Chef  Tobacco Use   Smoking status: Never   Smokeless tobacco: Never  Vaping Use   Vaping status: Never Used  Substance and Sexual Activity   Alcohol use: No    Alcohol/week: 0.0 standard drinks of alcohol   Drug use: No   Sexual activity: Never    Partners: Female  Other Topics Concern   Not on file  Social History Narrative   Not on file   Social Determinants of Health   Financial Resource Strain: Not on file  Food Insecurity: Not on file   Transportation Needs: Not on file  Physical Activity: Not on file  Stress: Not on file  Social Connections: Not on file    Family History  Problem Relation Age of Onset   Thyroid disease Maternal Grandmother    Hypertension Maternal Grandfather     Health Maintenance  Topic Date Due   COVID-19 Vaccine (5 - 2023-24 season) 02/16/2023 (Originally 02/10/2022)   INFLUENZA VACCINE  09/10/2023 (Originally 01/11/2023)   Hepatitis C Screening  01/31/2024 (Originally 10/06/2016)   HIV Screening  01/31/2024 (Originally 10/06/2013)   DTaP/Tdap/Td (9 - Td or Tdap) 09/16/2030   HPV VACCINES  Completed     ----------------------------------------------------------------------------------------------------------------------------------------------------------------------------------------------------------------- Physical Exam BP 111/71   Pulse 93   Ht 6\' 2"  (1.88 m)   Wt 142 lb (64.4 kg)   SpO2 99%   BMI 18.23 kg/m   Physical Exam Constitutional:      General: He is not in acute distress. HENT:     Head: Normocephalic and atraumatic.     Right Ear: Tympanic membrane and external ear normal.     Left Ear: Tympanic membrane and external ear normal.  Eyes:     General: No scleral icterus. Neck:     Thyroid: No thyromegaly.  Cardiovascular:     Rate and Rhythm: Normal rate and regular rhythm.     Heart sounds: Normal heart sounds.  Pulmonary:     Effort:  Pulmonary effort is normal.     Breath sounds: Normal breath sounds.  Abdominal:     General: Bowel sounds are normal. There is no distension.     Palpations: Abdomen is soft.     Tenderness: There is no abdominal tenderness. There is no guarding.  Musculoskeletal:     Cervical back: Normal range of motion.  Lymphadenopathy:     Cervical: No cervical adenopathy.  Skin:    General: Skin is warm and dry.     Findings: No rash.  Neurological:     Mental Status: He is alert and oriented to person, place, and time.     Cranial  Nerves: No cranial nerve deficit.     Motor: No abnormal muscle tone.  Psychiatric:        Mood and Affect: Mood normal.        Behavior: Behavior normal.     ------------------------------------------------------------------------------------------------------------------------------------------------------------------------------------------------------------------- Assessment and Plan  Well adult exam Well adult Orders Placed This Encounter  Procedures   TSH   Lipid Panel w/reflex Direct LDL   COMPLETE METABOLIC PANEL WITH GFR   CBC with Differential/Platelet  Screenings: per lab orders Immunizations:  UTD Anticipatory guidance/Risk factor reduction:  Recommendations per AVS.    No orders of the defined types were placed in this encounter.   No follow-ups on file.    This visit occurred during the SARS-CoV-2 public health emergency.  Safety protocols were in place, including screening questions prior to the visit, additional usage of staff PPE, and extensive cleaning of exam room while observing appropriate contact time as indicated for disinfecting solutions.

## 2023-02-01 LAB — CBC WITH DIFFERENTIAL/PLATELET
Basophils Absolute: 0 10*3/uL (ref 0.0–0.2)
Basos: 1 %
EOS (ABSOLUTE): 0.3 10*3/uL (ref 0.0–0.4)
Eos: 4 %
Hematocrit: 49.4 % (ref 37.5–51.0)
Hemoglobin: 16.4 g/dL (ref 13.0–17.7)
Immature Grans (Abs): 0 10*3/uL (ref 0.0–0.1)
Immature Granulocytes: 0 %
Lymphocytes Absolute: 2.2 10*3/uL (ref 0.7–3.1)
Lymphs: 36 %
MCH: 29.6 pg (ref 26.6–33.0)
MCHC: 33.2 g/dL (ref 31.5–35.7)
MCV: 89 fL (ref 79–97)
Monocytes Absolute: 0.5 10*3/uL (ref 0.1–0.9)
Monocytes: 9 %
Neutrophils Absolute: 3.1 10*3/uL (ref 1.4–7.0)
Neutrophils: 50 %
Platelets: 219 10*3/uL (ref 150–450)
RBC: 5.54 x10E6/uL (ref 4.14–5.80)
RDW: 12.7 % (ref 11.6–15.4)
WBC: 6.2 10*3/uL (ref 3.4–10.8)

## 2023-02-01 LAB — CMP14+EGFR
ALT: 27 IU/L (ref 0–44)
AST: 22 IU/L (ref 0–40)
Albumin: 4.7 g/dL (ref 4.3–5.2)
Alkaline Phosphatase: 68 IU/L (ref 44–121)
BUN/Creatinine Ratio: 14 (ref 9–20)
BUN: 15 mg/dL (ref 6–20)
Bilirubin Total: 0.8 mg/dL (ref 0.0–1.2)
CO2: 25 mmol/L (ref 20–29)
Calcium: 10 mg/dL (ref 8.7–10.2)
Chloride: 102 mmol/L (ref 96–106)
Creatinine, Ser: 1.07 mg/dL (ref 0.76–1.27)
Globulin, Total: 2.7 g/dL (ref 1.5–4.5)
Glucose: 97 mg/dL (ref 70–99)
Potassium: 3.8 mmol/L (ref 3.5–5.2)
Sodium: 141 mmol/L (ref 134–144)
Total Protein: 7.4 g/dL (ref 6.0–8.5)
eGFR: 99 mL/min/{1.73_m2} (ref >59)

## 2023-02-01 LAB — LIPID PANEL WITH LDL/HDL RATIO
Cholesterol, Total: 161 mg/dL (ref 100–199)
HDL: 59 mg/dL (ref >39)
LDL Chol Calc (NIH): 89 mg/dL (ref 0–99)
LDL/HDL Ratio: 1.5 ratio (ref 0.0–3.6)
Triglycerides: 65 mg/dL (ref 0–149)
VLDL Cholesterol Cal: 13 mg/dL (ref 5–40)

## 2023-02-01 LAB — TSH: TSH: 1.76 u[IU]/mL (ref 0.450–4.500)

## 2024-03-28 ENCOUNTER — Telehealth (INDEPENDENT_AMBULATORY_CARE_PROVIDER_SITE_OTHER): Payer: Self-pay | Admitting: Family

## 2024-03-28 VITALS — Temp 100.0°F

## 2024-03-28 DIAGNOSIS — J069 Acute upper respiratory infection, unspecified: Secondary | ICD-10-CM

## 2024-03-28 DIAGNOSIS — R52 Pain, unspecified: Secondary | ICD-10-CM

## 2024-03-28 MED ORDER — METHYLPREDNISOLONE 4 MG PO TBPK: ORAL_TABLET | ORAL | 0 refills | Status: AC

## 2024-03-28 MED ORDER — AZITHROMYCIN 250 MG PO TABS: ORAL_TABLET | ORAL | 0 refills | Status: AC

## 2024-03-28 NOTE — Progress Notes (Unsigned)
   Virtual Visit via Video   I connected with patient on 03/28/24 at 11:20 AM EDT by a video enabled telemedicine application and verified that I am speaking with the correct person using two identifiers.  Location patient: Home Location provider: [facility] Persons participating in the virtual visit: Patient, Provider, CMA (***)  I discussed the limitations of evaluation and management by telemedicine and the availability of in person appointments. The patient expressed understanding and agreed to proceed.  Subjective:   HPI:   ***  ROS:   See pertinent positives and negatives per HPI.  Patient Active Problem List   Diagnosis Date Noted   Well adult exam 10/20/2019    Social History   Tobacco Use   Smoking status: Never   Smokeless tobacco: Never  Substance Use Topics   Alcohol use: No    Alcohol/week: 0.0 standard drinks of alcohol    Current Outpatient Medications:    azithromycin (ZITHROMAX) 250 MG tablet, Take 2 tablets on day 1, then 1 tablet daily on days 2 through 5, Disp: 6 tablet, Rfl: 0   methylPREDNISolone (MEDROL DOSEPAK) 4 MG TBPK tablet, As directed, Disp: 21 tablet, Rfl: 0  No Known Allergies  Objective:   Temp 100 F (37.8 C)   Patient is well-developed, well-nourished in no acute distress.  Resting comfortably *** at home.  Head is normocephalic, atraumatic.  No labored breathing.  Speech is clear and coherent with logical content.  Patient is alert and oriented at baseline.  ***  Assessment and Plan:        ***.   Corde Antonini B [place] Moyd, FNP 03/28/2024  Time spent with the patient: *** minutes, of which >50% was spent in obtaining information about symptoms, reviewing previous labs, evaluations, and treatments, counseling about condition (please see the discussed topics above), and developing a plan to further investigate it; had a number of questions which I addressed.
